# Patient Record
Sex: Female | Born: 2000 | Hispanic: No | Smoking: Never smoker
Health system: Southern US, Community
[De-identification: ages and names within clinical notes are randomized; demographics above are authoritative.]

## PROBLEM LIST (undated history)

## (undated) DIAGNOSIS — B54 Unspecified malaria: Secondary | ICD-10-CM

## (undated) HISTORY — DX: Unspecified malaria: B54

---
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
## 2018-03-28 ENCOUNTER — Encounter (HOSPITAL_COMMUNITY): Payer: Self-pay

## 2018-03-28 ENCOUNTER — Emergency Department (HOSPITAL_COMMUNITY): Payer: Medicaid Other

## 2018-03-28 ENCOUNTER — Emergency Department (HOSPITAL_COMMUNITY)
Admission: EM | Admit: 2018-03-28 | Discharge: 2018-03-28 | Disposition: A | Discharge status: Home / Self Care | Payer: Medicaid Other | Attending: Emergency Medicine | Admitting: Emergency Medicine

## 2018-03-28 ENCOUNTER — Other Ambulatory Visit: Payer: Self-pay

## 2018-03-28 DIAGNOSIS — Y999 Unspecified external cause status: Secondary | ICD-10-CM | POA: Insufficient documentation

## 2018-03-28 DIAGNOSIS — Y9389 Activity, other specified: Secondary | ICD-10-CM | POA: Diagnosis not present

## 2018-03-28 DIAGNOSIS — S0083XA Contusion of other part of head, initial encounter: Secondary | ICD-10-CM | POA: Diagnosis not present

## 2018-03-28 DIAGNOSIS — S8991XA Unspecified injury of right lower leg, initial encounter: Secondary | ICD-10-CM | POA: Diagnosis not present

## 2018-03-28 DIAGNOSIS — S8001XA Contusion of right knee, initial encounter: Secondary | ICD-10-CM | POA: Diagnosis not present

## 2018-03-28 DIAGNOSIS — R079 Chest pain, unspecified: Secondary | ICD-10-CM | POA: Diagnosis not present

## 2018-03-28 DIAGNOSIS — M25561 Pain in right knee: Secondary | ICD-10-CM | POA: Diagnosis not present

## 2018-03-28 DIAGNOSIS — S0990XA Unspecified injury of head, initial encounter: Secondary | ICD-10-CM | POA: Diagnosis not present

## 2018-03-28 DIAGNOSIS — R52 Pain, unspecified: Secondary | ICD-10-CM | POA: Diagnosis not present

## 2018-03-28 DIAGNOSIS — Y929 Unspecified place or not applicable: Secondary | ICD-10-CM | POA: Diagnosis not present

## 2018-03-28 DIAGNOSIS — R0789 Other chest pain: Secondary | ICD-10-CM | POA: Diagnosis not present

## 2018-03-28 DIAGNOSIS — S299XXA Unspecified injury of thorax, initial encounter: Secondary | ICD-10-CM | POA: Diagnosis not present

## 2018-03-28 DIAGNOSIS — S0003XA Contusion of scalp, initial encounter: Secondary | ICD-10-CM

## 2018-03-28 MED ORDER — CYCLOBENZAPRINE HCL 10 MG PO TABS: 10.0000 mg | ORAL_TABLET | Freq: Two times a day (BID) | ORAL | 0 refills | Status: DC | PRN

## 2018-03-28 MED ORDER — ACETAMINOPHEN 325 MG PO TABS
650.0000 mg | ORAL_TABLET | Freq: Once | ORAL | Status: AC
  Administered 2018-03-28: 650 mg via ORAL
  Filled 2018-03-28: qty 2

## 2018-03-28 MED ORDER — IBUPROFEN 400 MG PO TABS
600.0000 mg | ORAL_TABLET | Freq: Once | ORAL | Status: AC
  Administered 2018-03-28: 600 mg via ORAL
  Filled 2018-03-28: qty 1
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
## 2018-03-28 NOTE — Discharge Instructions (Signed)
You can take 2 ibuprofen and one Tylenol together every 6 hours for the pain.  Also you were prescribed a muscle relaxer that you can take at night before you go to bed to help with the stiffness in the morning.  However the muscle relaxer may make you feel sleepy.  The pain will worsen over the next 2 days and will hurt the most most likely on Sunday or Monday.  This is normal.  Continue to rest and take it easy.
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
## 2018-03-28 NOTE — ED Notes (Signed)
Pt moved to progression bed

## 2018-03-28 NOTE — ED Notes (Signed)
Pt verbalized understanding of d/c instructions and has no further questions, VSS, NAD.  

## 2018-03-28 NOTE — ED Notes (Signed)
c-collar removed by Dr Anitra Lauth

## 2018-10-09 DIAGNOSIS — R51 Headache: Secondary | ICD-10-CM | POA: Diagnosis not present

## 2018-10-09 DIAGNOSIS — Y999 Unspecified external cause status: Secondary | ICD-10-CM | POA: Diagnosis not present

## 2018-10-09 DIAGNOSIS — M542 Cervicalgia: Secondary | ICD-10-CM | POA: Diagnosis not present

## 2018-10-09 DIAGNOSIS — Z3202 Encounter for pregnancy test, result negative: Secondary | ICD-10-CM | POA: Diagnosis not present

## 2018-10-09 DIAGNOSIS — Y92410 Unspecified street and highway as the place of occurrence of the external cause: Secondary | ICD-10-CM | POA: Diagnosis not present

## 2018-10-09 DIAGNOSIS — R072 Precordial pain: Secondary | ICD-10-CM | POA: Diagnosis not present

## 2018-10-09 DIAGNOSIS — M79659 Pain in unspecified thigh: Secondary | ICD-10-CM | POA: Diagnosis not present

## 2018-10-09 DIAGNOSIS — M79604 Pain in right leg: Secondary | ICD-10-CM | POA: Diagnosis not present

## 2018-10-09 DIAGNOSIS — R000 Tachycardia, unspecified: Secondary | ICD-10-CM | POA: Diagnosis not present

## 2018-10-09 DIAGNOSIS — R079 Chest pain, unspecified: Secondary | ICD-10-CM | POA: Diagnosis not present

## 2018-10-09 DIAGNOSIS — R52 Pain, unspecified: Secondary | ICD-10-CM | POA: Diagnosis not present

## 2018-10-09 DIAGNOSIS — Z041 Encounter for examination and observation following transport accident: Secondary | ICD-10-CM | POA: Diagnosis not present

## 2018-10-10 DIAGNOSIS — I452 Bifascicular block: Secondary | ICD-10-CM | POA: Diagnosis not present

## 2018-11-20 DIAGNOSIS — Z23 Encounter for immunization: Secondary | ICD-10-CM | POA: Diagnosis not present

## 2019-04-02 ENCOUNTER — Ambulatory Visit: Payer: Medicaid Other | Attending: Internal Medicine

## 2019-05-20 DIAGNOSIS — Z113 Encounter for screening for infections with a predominantly sexual mode of transmission: Secondary | ICD-10-CM | POA: Diagnosis not present

## 2019-06-02 ENCOUNTER — Other Ambulatory Visit: Payer: Self-pay | Admitting: Pediatrics

## 2019-06-02 ENCOUNTER — Encounter: Payer: Self-pay | Admitting: Pediatrics

## 2019-06-02 NOTE — Progress Notes (Unsigned)
Adolescent Well Care Visit Melissa Lutz is a 19 y.o. female who is here for well care.    PCP:  Tilman Neat, MD   History was provided by the {CHL AMB PERSONS; PED RELATIVES/OTHER W/[phone]}.  Confidentiality was discussed with the patient and, if applicable, with caregiver as well. Patient's personal or confidential phone number: ***  Current Issues: Current concerns include ***.  Last well visit many years ago; most salient problem recurrent herpes labialis Last BMI 2019 : ~ 67%ile Current meds:  Nutrition: Nutrition/eating behaviors: *** Adequate calcium in diet?: *** Supplements/ vitamins: ***  Exercise/ Media: Play any sports? *** Exercise: *** Screen time:  {CHL AMB SCREEN TIME:[phone]} Media rules or monitoring?: {YES NO:22349}  Sleep:  Sleep: ***  Social Screening: Lives with:  *** Parental relations:  {CHL AMB PED FAM RELATIONSHIPS:[phone]} Activities, work, and chores?: *** Concerns regarding behavior with peers?  {yes***/no:17258} Stressors of note: {Responses; yes**/no:17258}  Education: School grade and name: ***  School performance: {performance:16655} School behavior: {misc; parental coping:16655}  Menstruation:   No LMP recorded. Menstrual history: ***   Tobacco?  {YES/NO/WILD CARDS:18581} Secondhand smoke exposure?  {YES/NO/WILD BDZHG:99242} Drugs/ETOH?  {YES/NO/WILD ASTMH:96222}  Sexually Active?  {YES [id number]   Pregnancy Prevention: ***  Safe at home, in school & in relationships?  {Yes or If no, why not?:20788} Safe to self?  {Yes or If no, why not?:20788}   Screenings: Patient has a dental home: {yes/no***:64::"yes"}  The patient completed the Rapid Assessment for Adolescent Preventive Services screening questionnaire and the following topics were identified as risk factors and discussed: {CHL AMB ASSESSMENT TOPICS:21012045} and counseling provided.  Other topics of anticipatory guidance related to  reproductive health, substance use and media use were discussed.     PHQ-9 completed and results indicated ***  Physical Exam:  There were no vitals filed for this visit. There were no vitals taken for this visit. Body mass index: body mass index is unknown because there is no height or weight on file. Growth percentile SmartLinks can only be used for patients less than 78 years old.  No exam data present  General Appearance:   {PE GENERAL APPEARANCE:22457}  HENT: normocephalic, no obvious abnormality, conjunctiva clear  Mouth:   oropharynx moist, palate, tongue and gums normal; teeth ***  Neck:   supple, no adenopathy; thyroid: symmetric, no enlargement, no tenderness/mass/nodules  Chest Normal female female with breasts: {EXAMLarrie Kass  Lungs:   clear to auscultation bilaterally, even air movement   Heart:   regular rate and rhythm, S1 and S2 normal, no murmurs   Abdomen:   soft, non-tender, normal bowel sounds; no mass, or organomegaly  GU {adol gu exam:315266}  Musculoskeletal:   tone and strength strong and symmetrical, all extremities full range of motion           Lymphatic:   no adenopathy  Skin/Hair/Nails:   skin warm and dry; no bruises, no rashes, no lesions  Neurologic:   oriented, no focal deficits; strength, gait, and coordination normal and age-appropriate     Assessment and Plan:   ***  BMI {ACTION; IS/IS LNL:89211941} appropriate for age  Hearing screening result:{normal/abnormal/not examined:14677} Vision screening result: {normal/abnormal/not examined:14677}  Counseling provided for {CHL AMB PED VACCINE COUNSELING:210130100} vaccine components No orders of the defined types were placed in this encounter.    No follow-ups on file.Leda Min, MD

## 2019-06-03 ENCOUNTER — Encounter: Payer: Self-pay | Admitting: Pediatrics

## 2019-06-03 ENCOUNTER — Other Ambulatory Visit: Payer: Self-pay

## 2019-06-03 ENCOUNTER — Other Ambulatory Visit (HOSPITAL_COMMUNITY)
Admission: RE | Admit: 2019-06-03 | Discharge: 2019-06-03 | Disposition: A | Discharge status: Home / Self Care | Payer: Medicaid Other | Source: Ambulatory Visit | Attending: Pediatrics | Admitting: Pediatrics

## 2019-06-03 ENCOUNTER — Ambulatory Visit (INDEPENDENT_AMBULATORY_CARE_PROVIDER_SITE_OTHER): Payer: Medicaid Other | Admitting: Pediatrics

## 2019-06-03 VITALS — BP 106/60 | HR 87 | Ht 63.82 in | Wt 159.2 lb

## 2019-06-03 DIAGNOSIS — Z3201 Encounter for pregnancy test, result positive: Secondary | ICD-10-CM | POA: Diagnosis not present

## 2019-06-03 DIAGNOSIS — Z113 Encounter for screening for infections with a predominantly sexual mode of transmission: Secondary | ICD-10-CM | POA: Diagnosis not present

## 2019-06-03 DIAGNOSIS — Z0110 Encounter for examination of ears and hearing without abnormal findings: Secondary | ICD-10-CM | POA: Diagnosis not present

## 2019-06-03 DIAGNOSIS — Z0100 Encounter for examination of eyes and vision without abnormal findings: Secondary | ICD-10-CM | POA: Diagnosis not present

## 2019-06-03 LAB — POCT URINE PREGNANCY: Preg Test, Ur: POSITIVE — AB

## 2019-06-03 LAB — POCT RAPID HIV: Rapid HIV, POC: NEGATIVE

## 2019-06-03 NOTE — Patient Instructions (Addendum)
Information about places to continue care: [facility] [address] [place], [place] [address] [phone]  Women's clinic at [facility] Department [address] [place] [phone] [email or web address]  Prenatal care at [facility] [email or web address] General phone number for [facility] is [phone]. Ask for prenatal care   Here are some offices taking new adult patients:  [facility] at [facility] [address] [place] [address] [phone]  [facility] [address] [place] [address] [phone]  [facility] [address] [place] [address] [phone]  [facility] [address] [place] [address] [phone]

## 2019-06-03 NOTE — Progress Notes (Signed)
Adolescent Well Care Visit Melissa Lutz is a 19 y.o. female who is here for well care.    PCP:  Tilman Neat, MD   History was provided by the patient.  Confidentiality was discussed with the patient and, if applicable, with caregiver as well. Patient's personal or confidential phone number: [phone]  Current Issues: Current concerns include  Possible pregnancy One episode of unprotected sex March 19 - approx 2 weeks ago One lifetime partner Good feeling about partner, now living in [place] with good job Menses due in next day or two; no menses missed Made appt also at Urgent Care for Friday to check pregnancy status   Last well visit July 2015 Interval visits for recurrent herpes labialis and menstrual cramps   Physical Exam:  Vitals:   06/03/19 0959  BP: 106/60  Pulse: 87  SpO2: 91%  Weight: 159 lb 3.2 oz (72.2 kg)  Height: 5' 3.82" (1.621 m)   BP 106/60 (BP Location: Right Arm, Patient Position: Sitting)   Pulse 87   Ht 5' 3.82" (1.621 m)   Wt 159 lb 3.2 oz (72.2 kg)   SpO2 91%   BMI 27.48 kg/m  Body mass index: body mass index is 27.48 kg/m. Growth percentile SmartLinks can only be used for patients less than 45 years old.   Hearing Screening   125Hz  250Hz  500Hz  1000Hz  2000Hz  3000Hz  4000Hz  6000Hz  8000Hz   Right ear:   20 20 20  20     Left ear:   20 20 20  20       Visual Acuity Screening   Right eye Left eye Both eyes  Without correction: 20/25 20/20 20/20   With correction:       General Appearance:   alert, oriented, no acute distress and well nourished  HENT: normocephalic, no obvious abnormality, conjunctiva clear       Neck:   supple, no adenopathy; thyroid: symmetric, no enlargement, no tenderness/mass/nodules  Chest Normal female with breasts:  Lungs:   clear to auscultation bilaterally, even air movement   Heart:   regular rate and rhythm, S1 and S2 normal, no murmurs   Abdomen:   soft, non-tender, normal bowel sounds; no mass, or  organomegaly           Skin/Hair/Nails:   skin warm and dry; no bruises, no rashes, no lesions  Neurologic:   oriented, no focal deficits; strength, gait, and coordination normal and age-appropriate     Assessment and Plan:   Positive pregnancy test Unlikely false positive  Comfortable with result and with informing partner May move to where partner lives and works on local prenatal care if staying in [place]  BMI is appropriate for age  Hearing screening result:normal Vision screening result: normal  No imms today - too old for flu here Orders Placed This Encounter  Procedures  . POCT Rapid HIV   Phone/MyChart follow up on STI screening done today Info given on adult practices taking new patients.  , MD

## 2019-06-04 LAB — URINE CYTOLOGY ANCILLARY ONLY
Chlamydia: NEGATIVE
Comment: NEGATIVE
Comment: NORMAL
Neisseria Gonorrhea: NEGATIVE

## 2019-06-04 NOTE — Progress Notes (Signed)
Please call Melissa Lutz and inform her that she has no sexually transmitted infection.  She is aware of pregnancy result.

## 2019-06-08 DIAGNOSIS — Z3201 Encounter for pregnancy test, result positive: Secondary | ICD-10-CM | POA: Diagnosis not present

## 2019-06-14 ENCOUNTER — Other Ambulatory Visit: Payer: Self-pay

## 2019-06-14 ENCOUNTER — Emergency Department (HOSPITAL_COMMUNITY): Payer: Medicaid Other

## 2019-06-14 ENCOUNTER — Emergency Department (HOSPITAL_COMMUNITY)
Admission: EM | Admit: 2019-06-14 | Discharge: 2019-06-14 | Disposition: A | Discharge status: Home / Self Care | Payer: Medicaid Other | Attending: Emergency Medicine | Admitting: Emergency Medicine

## 2019-06-14 DIAGNOSIS — Y999 Unspecified external cause status: Secondary | ICD-10-CM | POA: Diagnosis not present

## 2019-06-14 DIAGNOSIS — Y929 Unspecified place or not applicable: Secondary | ICD-10-CM | POA: Insufficient documentation

## 2019-06-14 DIAGNOSIS — M25572 Pain in left ankle and joints of left foot: Secondary | ICD-10-CM | POA: Insufficient documentation

## 2019-06-14 DIAGNOSIS — S39012A Strain of muscle, fascia and tendon of lower back, initial encounter: Secondary | ICD-10-CM | POA: Insufficient documentation

## 2019-06-14 DIAGNOSIS — S161XXA Strain of muscle, fascia and tendon at neck level, initial encounter: Secondary | ICD-10-CM | POA: Diagnosis not present

## 2019-06-14 DIAGNOSIS — M542 Cervicalgia: Secondary | ICD-10-CM | POA: Insufficient documentation

## 2019-06-14 DIAGNOSIS — M545 Low back pain: Secondary | ICD-10-CM | POA: Diagnosis present

## 2019-06-14 DIAGNOSIS — Y939 Activity, unspecified: Secondary | ICD-10-CM | POA: Insufficient documentation

## 2019-06-14 DIAGNOSIS — S199XXA Unspecified injury of neck, initial encounter: Secondary | ICD-10-CM | POA: Diagnosis not present

## 2019-06-14 DIAGNOSIS — R52 Pain, unspecified: Secondary | ICD-10-CM | POA: Diagnosis not present

## 2019-06-14 MED ORDER — METHOCARBAMOL 500 MG PO TABS
500.0000 mg | ORAL_TABLET | Freq: Once | ORAL | Status: AC
  Administered 2019-06-14: 500 mg via ORAL
  Filled 2019-06-14: qty 1

## 2019-06-14 MED ORDER — ACETAMINOPHEN 500 MG PO TABS
1000.0000 mg | ORAL_TABLET | Freq: Once | ORAL | Status: AC
  Administered 2019-06-14: 1000 mg via ORAL
  Filled 2019-06-14: qty 2

## 2019-06-14 MED ORDER — NAPROXEN 250 MG PO TABS
500.0000 mg | ORAL_TABLET | Freq: Once | ORAL | Status: AC
  Administered 2019-06-14: 500 mg via ORAL
  Filled 2019-06-14: qty 2

## 2019-06-14 NOTE — Discharge Instructions (Signed)
Alternate ice and heat to areas of injury 3-4 times per day to limit inflammation and spasm.  Avoid strenuous activity and heavy lifting.  We recommend consistent use of Tylenol for pain. You are unable to take Advil or Aleve while pregnant.  We recommend follow-up with a primary care doctor to ensure resolution of symptoms.  Return to the ED for any new or concerning symptoms.
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
## 2019-06-14 NOTE — ED Notes (Addendum)
This RN marked the Robaxin and Naproxen as given. Pt did not receive the medications after disclosing she is pregnant. Medications wasted, witnessed by Leory Plowman, RN. Tresa Endo, PA notified.
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
## 2019-06-14 NOTE — ED Notes (Signed)
Discharge instructions reviewed with pt. Pt verbalized understanding.   

## 2019-07-17 ENCOUNTER — Encounter: Payer: Self-pay | Admitting: Pediatrics

## 2019-08-14 DIAGNOSIS — Z331 Pregnant state, incidental: Secondary | ICD-10-CM | POA: Diagnosis not present

## 2019-08-27 DIAGNOSIS — Z113 Encounter for screening for infections with a predominantly sexual mode of transmission: Secondary | ICD-10-CM | POA: Diagnosis not present

## 2019-08-27 DIAGNOSIS — O3680X1 Pregnancy with inconclusive fetal viability, fetus 1: Secondary | ICD-10-CM | POA: Diagnosis not present

## 2019-08-27 DIAGNOSIS — Z1379 Encounter for other screening for genetic and chromosomal anomalies: Secondary | ICD-10-CM | POA: Diagnosis not present

## 2019-08-27 DIAGNOSIS — Z3491 Encounter for supervision of normal pregnancy, unspecified, first trimester: Secondary | ICD-10-CM | POA: Diagnosis not present

## 2019-08-27 DIAGNOSIS — Z118 Encounter for screening for other infectious and parasitic diseases: Secondary | ICD-10-CM | POA: Diagnosis not present

## 2019-09-29 DIAGNOSIS — Z3491 Encounter for supervision of normal pregnancy, unspecified, first trimester: Secondary | ICD-10-CM | POA: Diagnosis not present

## 2019-09-29 DIAGNOSIS — O2692 Pregnancy related conditions, unspecified, second trimester: Secondary | ICD-10-CM | POA: Diagnosis not present

## 2019-09-29 DIAGNOSIS — Z3492 Encounter for supervision of normal pregnancy, unspecified, second trimester: Secondary | ICD-10-CM | POA: Diagnosis not present

## 2019-11-05 ENCOUNTER — Encounter: Payer: Self-pay | Admitting: Pediatrics

## 2019-12-14 DIAGNOSIS — Z3492 Encounter for supervision of normal pregnancy, unspecified, second trimester: Secondary | ICD-10-CM | POA: Diagnosis not present

## 2019-12-14 DIAGNOSIS — Z3491 Encounter for supervision of normal pregnancy, unspecified, first trimester: Secondary | ICD-10-CM | POA: Diagnosis not present

## 2020-02-22 DIAGNOSIS — Z3A41 41 weeks gestation of pregnancy: Secondary | ICD-10-CM | POA: Diagnosis not present

## 2020-02-22 DIAGNOSIS — O480 Post-term pregnancy: Secondary | ICD-10-CM | POA: Diagnosis not present

## 2020-02-22 DIAGNOSIS — Z20822 Contact with and (suspected) exposure to covid-19: Secondary | ICD-10-CM | POA: Diagnosis not present

## 2020-02-25 DIAGNOSIS — Z3A00 Weeks of gestation of pregnancy not specified: Secondary | ICD-10-CM | POA: Diagnosis not present

## 2020-02-25 DIAGNOSIS — Z3A41 41 weeks gestation of pregnancy: Secondary | ICD-10-CM | POA: Diagnosis not present

## 2020-02-28 DIAGNOSIS — O9089 Other complications of the puerperium, not elsewhere classified: Secondary | ICD-10-CM | POA: Diagnosis not present

## 2020-02-28 DIAGNOSIS — Z9889 Other specified postprocedural states: Secondary | ICD-10-CM | POA: Diagnosis not present

## 2020-02-28 DIAGNOSIS — R519 Headache, unspecified: Secondary | ICD-10-CM | POA: Diagnosis not present

## 2020-03-10 DIAGNOSIS — O9953 Diseases of the respiratory system complicating the puerperium: Secondary | ICD-10-CM | POA: Diagnosis not present

## 2020-03-10 DIAGNOSIS — J029 Acute pharyngitis, unspecified: Secondary | ICD-10-CM | POA: Diagnosis not present

## 2020-03-10 DIAGNOSIS — Z20822 Contact with and (suspected) exposure to covid-19: Secondary | ICD-10-CM | POA: Diagnosis not present

## 2020-03-10 DIAGNOSIS — R059 Cough, unspecified: Secondary | ICD-10-CM | POA: Diagnosis not present

## 2020-03-10 DIAGNOSIS — O9089 Other complications of the puerperium, not elsewhere classified: Secondary | ICD-10-CM | POA: Diagnosis not present

## 2020-03-10 DIAGNOSIS — R079 Chest pain, unspecified: Secondary | ICD-10-CM | POA: Diagnosis not present

## 2020-03-23 DIAGNOSIS — Z1152 Encounter for screening for COVID-19: Secondary | ICD-10-CM | POA: Diagnosis not present

## 2020-03-23 DIAGNOSIS — K5909 Other constipation: Secondary | ICD-10-CM | POA: Diagnosis not present

## 2020-03-23 DIAGNOSIS — K648 Other hemorrhoids: Secondary | ICD-10-CM | POA: Diagnosis not present

## 2020-04-05 DIAGNOSIS — K5900 Constipation, unspecified: Secondary | ICD-10-CM | POA: Diagnosis not present

## 2020-04-05 DIAGNOSIS — K648 Other hemorrhoids: Secondary | ICD-10-CM | POA: Diagnosis not present

## 2020-04-14 ENCOUNTER — Telehealth: Payer: Self-pay

## 2020-04-14 DIAGNOSIS — Z09 Encounter for follow-up examination after completed treatment for conditions other than malignant neoplasm: Secondary | ICD-10-CM

## 2020-04-14 DIAGNOSIS — Z3202 Encounter for pregnancy test, result negative: Secondary | ICD-10-CM | POA: Diagnosis not present

## 2020-04-14 DIAGNOSIS — Z3046 Encounter for surveillance of implantable subdermal contraceptive: Secondary | ICD-10-CM | POA: Diagnosis not present

## 2020-04-14 DIAGNOSIS — R102 Pelvic and perineal pain: Secondary | ICD-10-CM | POA: Diagnosis not present

## 2020-04-14 NOTE — Telephone Encounter (Signed)
Letter of notice to transition to Adult primary care was returned to sender. Pt dismissed from [facility] due to age of 31. Can contact SWCM if needing support selecting adult PCP.    Kenn File, BSW, QP Case Manager [facility] [employer] [facility] Office: [phone] Direct Number: [phone]

## 2020-06-08 DIAGNOSIS — Z202 Contact with and (suspected) exposure to infections with a predominantly sexual mode of transmission: Secondary | ICD-10-CM | POA: Diagnosis not present

## 2020-06-08 DIAGNOSIS — Z124 Encounter for screening for malignant neoplasm of cervix: Secondary | ICD-10-CM | POA: Diagnosis not present

## 2020-06-08 DIAGNOSIS — Z0001 Encounter for general adult medical examination with abnormal findings: Secondary | ICD-10-CM | POA: Diagnosis not present

## 2020-06-08 DIAGNOSIS — Z01419 Encounter for gynecological examination (general) (routine) without abnormal findings: Secondary | ICD-10-CM | POA: Diagnosis not present

## 2020-09-17 ENCOUNTER — Encounter (HOSPITAL_COMMUNITY): Payer: Self-pay

## 2020-09-17 ENCOUNTER — Ambulatory Visit (HOSPITAL_COMMUNITY)
Admission: EM | Admit: 2020-09-17 | Discharge: 2020-09-17 | Disposition: A | Discharge status: Home / Self Care | Payer: Medicaid Other | Attending: Internal Medicine | Admitting: Internal Medicine

## 2020-09-17 DIAGNOSIS — Z3202 Encounter for pregnancy test, result negative: Secondary | ICD-10-CM | POA: Diagnosis not present

## 2020-09-17 DIAGNOSIS — Z113 Encounter for screening for infections with a predominantly sexual mode of transmission: Secondary | ICD-10-CM

## 2020-09-17 LAB — POCT URINALYSIS DIPSTICK, ED / UC
Bilirubin Urine: NEGATIVE
Glucose, UA: NEGATIVE mg/dL
Hgb urine dipstick: NEGATIVE
Ketones, ur: NEGATIVE mg/dL
Leukocytes,Ua: NEGATIVE
Nitrite: NEGATIVE
Protein, ur: NEGATIVE mg/dL
Specific Gravity, Urine: 1.025 (ref 1.005–1.030)
Urobilinogen, UA: 0.2 mg/dL (ref 0.0–1.0)
pH: 5.5 (ref 5.0–8.0)

## 2020-09-17 LAB — POC URINE PREG, ED: Preg Test, Ur: NEGATIVE

## 2020-09-17 NOTE — ED Triage Notes (Signed)
Pt presents for pregnancy and STD testing.   States she has noticed her stomach is getting bigger and c/o nausea.

## 2020-09-17 NOTE — Discharge Instructions (Addendum)
Will call with test results Follow up with OBGYN for menstrual cycle concerns.

## 2020-09-17 NOTE — ED Provider Notes (Signed)
[facility]    CSN: [id number] Arrival date & time: 09/17/20  1141      History   Chief Complaint Chief Complaint  Patient presents with   Possible Pregnancy   SEXUALLY TRANSMITTED DISEASE    HPI Melissa Lutz is a 20 y.o. female.   Pt with nexplanon in place, placed about 4 months ago.  Pt is 6 months postpartum.  She is currently breastfeeding.  LMP about 2 months ago.  Requesting pregnancy test today. Follows with OBGYN.   She is also requesting STD testing. Pt one monogamous relationship.  Partner asymptomatic at this time.    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 No pertinent surgical history.  OB History     Gravida  0   Para  0   Term  0   Preterm  0   AB  0   Living         SAB  0   IAB  0   Ectopic  0   Multiple      Live Births               Home Medications    Prior to Admission medications   Not on File    Family History Family History  Problem Relation Age of Onset   Heart disease Mother 25       SD secvundum   Hypertension Mother    Asthma Neg Hx    Cancer Neg Hx    Diabetes Neg Hx    Drug abuse Neg Hx    Early death Neg Hx     Social History Social History   Tobacco Use   Smoking status: Never   Smokeless tobacco: Never  Substance Use Topics   Alcohol use: No     Allergies   Nickel   Review of Systems Review of Systems  Constitutional:  Negative for chills and fever.  HENT:  Negative for ear pain and sore throat.   Eyes:  Negative for pain and visual disturbance.  Respiratory:  Negative for cough and shortness of breath.   Cardiovascular:  Negative for chest pain and  palpitations.  Gastrointestinal:  Negative for abdominal pain and vomiting.  Genitourinary:  Negative for difficulty urinating, dysuria, flank pain, frequency, hematuria, menstrual problem, pelvic pain, vaginal bleeding, vaginal discharge and vaginal pain.  Musculoskeletal:  Negative for arthralgias and back pain.  Skin:  Negative for color change and rash.  Neurological:  Negative for seizures and syncope.  All other systems reviewed and are negative.   Physical Exam Triage Vital Signs ED Triage Vitals  Enc Vitals Group     BP 09/17/20 1205 111/69     Pulse Rate 09/17/20 1205 81     Resp 09/17/20 1205 19     Temp 09/17/20 1205 98.1 F (36.7 C)     Temp Source 09/17/20 1205 Oral     SpO2 09/17/20 1205 99 %     Weight --      Height --      Head Circumference --      Peak Flow --      Pain Score  09/17/20 1203 0     Pain Loc --      Pain Edu? --      Excl. in GC? --    No data found.  Updated Vital Signs BP 111/69 (BP Location: Left Arm)   Pulse 81   Temp 98.1 F (36.7 C) (Oral)   Resp 19   LMP  (LMP Unknown)   SpO2 99%   Visual Acuity Right Eye Distance:   Left Eye Distance:   Bilateral Distance:    Right Eye Near:   Left Eye Near:    Bilateral Near:     Physical Exam Vitals and nursing note reviewed.  Constitutional:      General: She is not in acute distress.    Appearance: She is well-developed.  HENT:     Head: Normocephalic and atraumatic.  Eyes:     Conjunctiva/sclera: Conjunctivae normal.  Cardiovascular:     Rate and Rhythm: Normal rate and regular rhythm.     Heart sounds: No murmur heard. Pulmonary:     Effort: Pulmonary effort is normal. No respiratory distress.     Breath sounds: Normal breath sounds.  Abdominal:     Palpations: Abdomen is soft.     Tenderness: There is no abdominal tenderness.  Musculoskeletal:     Cervical back: Neck supple.  Skin:    General: Skin is warm and dry.  Neurological:     Mental Status: She is alert.      UC Treatments / Results  Labs (all labs ordered are listed, but only abnormal results are displayed) Labs Reviewed  POCT URINALYSIS DIPSTICK, ED / UC  POC URINE PREG, ED    EKG   Radiology No results found.  Procedures Procedures (including critical care time)  Medications Ordered in UC Medications - No data to display  Initial Impression / Assessment and Plan / UC Course  I have reviewed the triage vital signs and the nursing notes.  Pertinent labs & imaging results that were available during my care of the patient were reviewed by me and considered in my medical decision making (see chart for details).     Urine pregnancy negative today.  Pt with nexplanon in place with irregular menstrual cycles.  STD testing pending. Pt asymptomatic, will treat based on test results.  Final Clinical Impressions(s) / UC Diagnoses   Final diagnoses:  None   Discharge Instructions   None    ED Prescriptions   None    PDMP not reviewed this encounter.   Jodell Cipro, PA-C 09/17/20 1253

## 2020-09-19 LAB — CERVICOVAGINAL ANCILLARY ONLY
Bacterial Vaginitis (gardnerella): NEGATIVE
Candida Glabrata: NEGATIVE
Candida Vaginitis: NEGATIVE
Chlamydia: NEGATIVE
Comment: NEGATIVE
Comment: NEGATIVE
Comment: NEGATIVE
Comment: NEGATIVE
Comment: NEGATIVE
Comment: NORMAL
Neisseria Gonorrhea: NEGATIVE
Trichomonas: NEGATIVE
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
## 2021-04-18 DIAGNOSIS — R635 Abnormal weight gain: Secondary | ICD-10-CM | POA: Diagnosis not present

## 2021-04-18 DIAGNOSIS — N925 Other specified irregular menstruation: Secondary | ICD-10-CM | POA: Diagnosis not present

## 2021-04-18 DIAGNOSIS — Z3046 Encounter for surveillance of implantable subdermal contraceptive: Secondary | ICD-10-CM | POA: Diagnosis not present

## 2021-07-19 DIAGNOSIS — Z01419 Encounter for gynecological examination (general) (routine) without abnormal findings: Secondary | ICD-10-CM | POA: Diagnosis not present

## 2021-07-19 DIAGNOSIS — Z124 Encounter for screening for malignant neoplasm of cervix: Secondary | ICD-10-CM | POA: Diagnosis not present

## 2021-07-19 DIAGNOSIS — Z0000 Encounter for general adult medical examination without abnormal findings: Secondary | ICD-10-CM | POA: Diagnosis not present

## 2021-09-12 DIAGNOSIS — R7989 Other specified abnormal findings of blood chemistry: Secondary | ICD-10-CM | POA: Diagnosis not present

## 2022-08-28 ENCOUNTER — Other Ambulatory Visit: Payer: Self-pay

## 2022-08-28 ENCOUNTER — Emergency Department (HOSPITAL_COMMUNITY): Payer: Medicaid Other

## 2022-08-28 ENCOUNTER — Emergency Department (HOSPITAL_COMMUNITY)
Admission: EM | Admit: 2022-08-28 | Discharge: 2022-08-28 | Disposition: A | Discharge status: Home / Self Care | Payer: Medicaid Other | Attending: Emergency Medicine | Admitting: Emergency Medicine

## 2022-08-28 DIAGNOSIS — Z9189 Other specified personal risk factors, not elsewhere classified: Secondary | ICD-10-CM

## 2022-08-28 DIAGNOSIS — Z7722 Contact with and (suspected) exposure to environmental tobacco smoke (acute) (chronic): Secondary | ICD-10-CM | POA: Diagnosis present

## 2022-08-28 LAB — CBC
HCT: 36.1 % (ref 36.0–46.0)
Hemoglobin: 11.4 g/dL — ABNORMAL LOW (ref 12.0–15.0)
MCH: 29.7 pg (ref 26.0–34.0)
MCHC: 31.6 g/dL (ref 30.0–36.0)
MCV: 94 fL (ref 80.0–100.0)
Platelets: 207 10*3/uL (ref 150–400)
RBC: 3.84 MIL/uL — ABNORMAL LOW (ref 3.87–5.11)
RDW: 12.6 % (ref 11.5–15.5)
WBC: 6.1 10*3/uL (ref 4.0–10.5)
nRBC: 0 % (ref 0.0–0.2)

## 2022-08-28 LAB — COMPREHENSIVE METABOLIC PANEL
ALT: 20 U/L (ref 0–44)
AST: 22 U/L (ref 15–41)
Albumin: 4 g/dL (ref 3.5–5.0)
Alkaline Phosphatase: 56 U/L (ref 38–126)
Anion gap: 12 (ref 5–15)
BUN: 10 mg/dL (ref 6–20)
CO2: 24 mmol/L (ref 22–32)
Calcium: 9.5 mg/dL (ref 8.9–10.3)
Chloride: 101 mmol/L (ref 98–111)
Creatinine, Ser: 0.62 mg/dL (ref 0.44–1.00)
GFR, Estimated: >60 mL/min (ref >60)
Glucose, Bld: 112 mg/dL — ABNORMAL HIGH (ref 70–99)
Potassium: 3.4 mmol/L — ABNORMAL LOW (ref 3.5–5.1)
Sodium: 137 mmol/L (ref 135–145)
Total Bilirubin: 0.3 mg/dL (ref 0.3–1.2)
Total Protein: 7.9 g/dL (ref 6.5–8.1)

## 2022-08-28 NOTE — ED Provider Notes (Signed)
[place] EMERGENCY DEPARTMENT AT [facility] Provider Note   CSN: [id number] Arrival date & time: 08/28/22  1811     History  Chief Complaint  Patient presents with   Sore Throat    Melissa Lutz is a 22 y.o. female.  The history is provided by the patient and medical records. No language interpreter was used.  Sore Throat   22 year old female with prior history of malaria, presenting with complaint of concerns for secondhand smoke.  Patient states she recently started a new job where she was around a coworker that smokes heavily.  The job is in house and she mention the smoke has greatly affected her health.  She has been in this job for the past 3 weeks and yesterday was her last day because she cannot tolerate the environment.  States that she is having burning sensation in her nose, throat irritation, having pain in her chest as well as having shortness of breath whenever she is at work.  Her symptoms did seem to improve when she goes home.  Patient states she was googling secondhand smoke and felt a lot of her symptoms related to that.  She also reported family history of asthma and has symptoms concerns for her.  She does not endorse any fever, productive cough, wheezing, or rash. Patient request for blood work to be done.    Home Medications Prior to Admission medications   Not on File      Allergies    Nickel    Review of Systems   Review of Systems  All other systems reviewed and are negative.   Physical Exam Updated Vital Signs BP 110/64 (BP Location: Left Arm)   Pulse 81   Temp 98.4 F (36.9 C) (Oral)   Resp 18   SpO2 100%  Physical Exam Vitals and nursing note reviewed.  Constitutional:      General: She is not in acute distress.    Appearance: She is well-developed.  HENT:     Head: Atraumatic.     Nose: No congestion or rhinorrhea.     Mouth/Throat:     Mouth: Mucous membranes are moist. No oral lesions.     Pharynx: Uvula  midline. No oropharyngeal exudate.  Eyes:     Conjunctiva/sclera: Conjunctivae normal.  Cardiovascular:     Rate and Rhythm: Normal rate and regular rhythm.     Pulses: Normal pulses.     Heart sounds: Normal heart sounds.  Pulmonary:     Effort: Pulmonary effort is normal.     Breath sounds: No wheezing, rhonchi or rales.  Abdominal:     Palpations: Abdomen is soft.  Musculoskeletal:     Cervical back: Neck supple.  Skin:    Findings: No rash.  Neurological:     Mental Status: She is alert.  Psychiatric:        Mood and Affect: Mood normal.     ED Results / Procedures / Treatments   Labs (all labs ordered are listed, but only abnormal results are displayed) Labs Reviewed  COMPREHENSIVE METABOLIC PANEL - Abnormal; Notable for the following components:      Result Value   Potassium 3.4 (*)    Glucose, Bld 112 (*)    All other components within normal limits  CBC - Abnormal; Notable for the following components:   RBC 3.84 (*)    Hemoglobin 11.4 (*)    All other components within normal limits    EKG None  Radiology  DG Chest 2 View  Result Date: 08/28/2022 CLINICAL DATA:  Cough and shortness of breath. EXAM: CHEST - 2 VIEW COMPARISON:  03/28/2018 FINDINGS: The cardiomediastinal contours are normal. The lungs are clear. Pulmonary vasculature is normal. No consolidation, pleural effusion, or pneumothorax. No acute osseous abnormalities are seen. IMPRESSION: Negative radiographs of the chest. Electronically Signed   By: Narda Rutherford M.D.   On: 08/28/2022 22:49    Procedures Procedures    Medications Ordered in ED Medications - No data to display  ED Course/ Medical Decision Making/ A&P                             Medical Decision Making Amount and/or Complexity of Data Reviewed Labs: ordered. Radiology: ordered.   BP 110/64 (BP Location: Left Arm)   Pulse 81   Temp 98.4 F (36.9 C) (Oral)   Resp 18   SpO2 100%   74:74 PM 22 year old female with  prior history of malaria, presenting with complaint of concerns for secondhand smoke.  Patient states she recently started a new job where she was around a coworker that smokes heavily.  The job is in house and she mention the smoke has greatly affected her health.  She has been in this job for the past 3 weeks and yesterday was her last day because she cannot tolerate the environment.  States that she is having burning sensation in her nose, throat irritation, having pain in her chest as well as having shortness of breath whenever she is at work.  Her symptoms did seem to improve when she goes home.  Patient states she was googling secondhand smoke and felt a lot of her symptoms related to that.  She also reported family history of asthma and has symptoms concerns for her.  She does not endorse any fever, productive cough, wheezing, or rash. Patient request for blood work to be done.    On exam patient is resting comfortably in the bed appears to be in no acute discomfort.  She is able to speak in complete sentences and showing no evidence of any respiratory distress.  Ear nose and throat exam unremarkable, lungs are clear to auscultation without any wheezes rales or rhonchi abdomen is soft nontender no concerning skin rash.  Vital signs overall reassuring no fever no hypoxia.  -Labs ordered, independently viewed and interpreted by me.  Labs remarkable for reassuring electrolytes panel -The patient was maintained on a cardiac monitor.  I personally viewed and interpreted the cardiac monitored which showed an underlying rhythm of: NSR -Imaging independently viewed and interpreted by me and I agree with radiologist's interpretation.  Result remarkable for CXR without acute changes -This patient presents to the ED for concern of sob, this involves an extensive number of treatment options, and is a complaint that carries with it a high risk of complications and morbidity.  The differential diagnosis includes  smoke inhalation, asthma, reactive airway disease, viral illness, pneumonia, ptx -Co morbidities that complicate the patient evaluation includes none -Treatment includes reassurance -Reevaluation of the patient after these medicines showed that the patient stayed the same -PCP office notes or outside notes reviewed -Escalation to admission/observation considered: patients feels much better, is comfortable with discharge, and will follow up with PCP -Prescription medication considered, patient comfortable with OTC meds -Social Determinant of Health considered   11:05 PM Patient primary concern is exposure to secondhand smoke from her coworker who was smoking while at  work. Patient without any history of asthma.  On exam she is overall reassuring.  Workup is normal.  Reassurance given.  Patient states she is no longer working that environment and therefore I felt she is stable to be discharged home.  Return precaution discussed.  I have low suspicion for carbon monoxide poisoning or other acute emergent medical condition.  Doubt viral illness.           Final Clinical Impression(s) / ED Diagnoses Final diagnoses:  At risk from secondhand smoke exposure    Rx / DC Orders ED Discharge Orders     None         Fayrene Helper, PA-C 08/28/22 2310    Eudelia Bunch Amadeo Garnet, MD 08/29/22 0800

## 2022-08-28 NOTE — ED Triage Notes (Signed)
Pt presents with sore throat, congestion, nose burning and itching, and feeling fatigued. She states she feels this is from prolonged exposure to second hand smoke.  Requesting lab work.

## 2022-08-28 NOTE — Discharge Instructions (Signed)
You have been evaluated for your symptoms.  Fortunately your blood work is overall reassuring.  No evidence of anemia no electrolyte imbalance.  Your chest x-ray is normal as well.  Please avoid smoke exposure.  Return if you have any concern.

## 2022-08-28 NOTE — ED Notes (Signed)
Pt transported to xray 

## 2022-10-23 ENCOUNTER — Encounter: Payer: 59 | Admitting: Radiology

## 2022-11-29 ENCOUNTER — Encounter: Payer: 59 | Admitting: Obstetrics & Gynecology

## 2024-01-17 ENCOUNTER — Ambulatory Visit (HOSPITAL_COMMUNITY)
Admission: EM | Admit: 2024-01-17 | Discharge: 2024-01-17 | Disposition: A | Discharge status: Home / Self Care | Attending: Emergency Medicine | Admitting: Emergency Medicine

## 2024-01-17 ENCOUNTER — Encounter (HOSPITAL_COMMUNITY): Payer: Self-pay

## 2024-01-17 DIAGNOSIS — Z113 Encounter for screening for infections with a predominantly sexual mode of transmission: Secondary | ICD-10-CM | POA: Diagnosis present

## 2024-01-17 DIAGNOSIS — J069 Acute upper respiratory infection, unspecified: Secondary | ICD-10-CM | POA: Diagnosis not present

## 2024-01-17 DIAGNOSIS — R0789 Other chest pain: Secondary | ICD-10-CM | POA: Diagnosis not present

## 2024-01-17 LAB — POCT URINE PREGNANCY: Preg Test, Ur: NEGATIVE

## 2024-01-17 MED ORDER — PROMETHAZINE-DM 6.25-15 MG/5ML PO SYRP: 5.0000 mL | ORAL_SOLUTION | Freq: Every evening | ORAL | 0 refills | Status: AC | PRN

## 2024-01-17 MED ORDER — AEROCHAMBER PLUS FLO-VU MEDIUM MISC
1.0000 | Freq: Once | Status: AC
  Administered 2024-01-17: 1

## 2024-01-17 MED ORDER — ALBUTEROL SULFATE HFA 108 (90 BASE) MCG/ACT IN AERS
INHALATION_SPRAY | RESPIRATORY_TRACT | Status: AC
  Filled 2024-01-17: qty 6.7

## 2024-01-17 MED ORDER — FLUCONAZOLE 150 MG PO TABS: 150.0000 mg | ORAL_TABLET | Freq: Once | ORAL | 0 refills | Status: AC | PRN

## 2024-01-17 MED ORDER — ALBUTEROL SULFATE HFA 108 (90 BASE) MCG/ACT IN AERS
2.0000 | INHALATION_SPRAY | Freq: Once | RESPIRATORY_TRACT | Status: AC
  Administered 2024-01-17: 2 via RESPIRATORY_TRACT

## 2024-01-17 MED ORDER — GUAIFENESIN ER 600 MG PO TB12: 1200.0000 mg | ORAL_TABLET | Freq: Two times a day (BID) | ORAL | 0 refills | Status: AC

## 2024-01-17 NOTE — Discharge Instructions (Addendum)
 You likely have a viral respiratory illness.  This is treated with supportive care. I recommend plain guaifenesin (Mucinex) for congestion. Drink lots of water and fluids.  The promethazine DM cough syrup can be used before bedtime. It might make you drowsy so use caution.  Please use the inhaler 3 times daily for the next 4-5 days in a row for chest discomfort. You can take tylenol  and/or ibuprofen  for any pain or fevers. Please allow 4-5 more days for improvement in symptoms with these treatments. If after this time period you have persisting or worsening symptoms, please return  We will call you if anything on your swab returns positive. You can also see these results on MyChart. Please abstain from sexual intercourse until your results return. In the meantime I am treating you for yeast with fluconazole.  Take 1 pill tomorrow (Saturday), and the second pill in 3 days (Tuesday)

## 2024-01-17 NOTE — ED Provider Notes (Signed)
 [facility]    CSN: [id number] Arrival date & time: 01/17/24  1834      History   Chief Complaint Chief Complaint  Patient presents with   Cough   Vaginal Itching    HPI Melissa Lutz is a 23 y.o. female.  3 day history of nasal congestion and cough Cough is dry, causes chest discomfort, tightness  No fevers. Denies shortness of breath or wheezing.  Slight sore throat. No abdominal pain, nausea/vomiting Has been drinking tea. No other interventions attempted Sick contacts at her workplace   Vaginal itching and burning sensation for 2 weeks. Notes redness around clitoral area. Was concerned about labial swelling. Increased discharge. Would like STD testing. 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 No pertinent surgical history.  OB History     Gravida  0   Para  0   Term  0   Preterm  0   AB  0   Living         SAB  0   IAB  0   Ectopic  0   Multiple      Live Births               Home Medications    Prior to Admission medications   Medication Sig Start Date End Date Taking? Authorizing Provider  fluconazole (DIFLUCAN) 150 MG tablet Take 1 tablet (150 mg total) by mouth once as needed for up to 2 doses (take one pill on day 1, and the second pill 3 days later). 01/17/24  Yes Trina Asch, Asberry, PA-C  guaiFENesin (MUCINEX) 600 MG 12 hr tablet Take 2 tablets (1,200 mg total) by mouth 2 (two) times daily. 01/17/24  Yes Genese Quebedeaux, Asberry, PA-C  promethazine-dextromethorphan (PROMETHAZINE-DM) 6.25-15 MG/5ML syrup Take 5 mLs by mouth at bedtime as needed for cough. 01/17/24  Yes Yehoshua Vitelli, Asberry, PA-C    Family  History Family History  Problem Relation Age of Onset   Heart disease Mother 37       SD secvundum   Hypertension Mother    Asthma Neg Hx    Cancer Neg Hx    Diabetes Neg Hx    Drug abuse Neg Hx    Early death Neg Hx     Social History Social History   Tobacco Use   Smoking status: Never   Smokeless tobacco: Never  Substance Use Topics   Alcohol use: No     Allergies   Nickel   Review of Systems Review of Systems As per HPI  Physical Exam Triage Vital Signs ED Triage Vitals  Encounter Vitals Group     BP 01/17/24 1926 126/75     Girls Systolic BP Percentile --      Girls Diastolic BP Percentile --      Boys Systolic BP Percentile --      Boys Diastolic BP Percentile --      Pulse Rate 01/17/24 1926 86     Resp 01/17/24 1926 18     Temp 01/17/24 1926 98.2 F (36.8 C)     Temp Source 01/17/24 1926 Oral     SpO2 01/17/24  1926 98 %     Weight 01/17/24 1925 149 lb 14.6 oz (68 kg)     Height 01/17/24 1925 5' 4 (1.626 m)     Head Circumference --      Peak Flow --      Pain Score 01/17/24 1924 8     Pain Loc --      Pain Education --      Exclude from Growth Chart --    No data found.  Updated Vital Signs BP 126/75 (BP Location: Right Arm)   Pulse 86   Temp 98.2 F (36.8 C) (Oral)   Resp 18   Ht 5' 4 (1.626 m)   Wt 149 lb 14.6 oz (68 kg)   LMP 12/30/2023 (Approximate)   SpO2 98%   BMI 25.73 kg/m    Physical Exam Vitals and nursing note reviewed. Exam conducted with a chaperone present (Demetrice CMA).  Constitutional:      Appearance: She is not ill-appearing.  HENT:     Right Ear: Tympanic membrane and ear canal normal.     Left Ear: Tympanic membrane and ear canal normal.     Nose: Congestion present. No rhinorrhea.     Mouth/Throat:     Mouth: Mucous membranes are moist.     Pharynx: Oropharynx is clear. No posterior oropharyngeal erythema.  Eyes:     Conjunctiva/sclera: Conjunctivae normal.  Cardiovascular:     Rate and Rhythm:  Normal rate and regular rhythm.     Pulses: Normal pulses.     Heart sounds: Normal heart sounds.  Pulmonary:     Effort: Pulmonary effort is normal. No respiratory distress.     Breath sounds: Normal breath sounds. No wheezing, rhonchi or rales.  Abdominal:     Palpations: Abdomen is soft.     Tenderness: There is no abdominal tenderness. There is no guarding.  Genitourinary:    Vagina: No signs of injury. Vaginal discharge present. No lesions.     Comments: No erythema, rash, lesions, or swelling. There is thin milky discharge on exam.  Musculoskeletal:     Cervical back: Normal range of motion.  Lymphadenopathy:     Cervical: No cervical adenopathy.  Skin:    General: Skin is warm and dry.  Neurological:     Mental Status: She is alert and oriented to person, place, and time.     UC Treatments / Results  Labs (all labs ordered are listed, but only abnormal results are displayed) Labs Reviewed  POCT URINE PREGNANCY  CERVICOVAGINAL ANCILLARY ONLY    EKG  Radiology No results found.  Procedures Procedures   Medications Ordered in UC Medications  albuterol (VENTOLIN HFA) 108 (90 Base) MCG/ACT inhaler 2 puff (2 puffs Inhalation Given 01/17/24 2028)  AeroChamber Plus Flo-Vu Medium MISC 1 each (1 each Other Given 01/17/24 2028)    Initial Impression / Assessment and Plan / UC Course  I have reviewed the triage vital signs and the nursing notes.  Pertinent labs & imaging results that were available during my care of the patient were reviewed by me and considered in my medical decision making (see chart for details).  Viral URI with cough Albuterol inhaler in clinic for chest discomfort. Continue TID. Mucinex, promethazine DM, tylenol /ibuprofen , increase fluids, other supportive care. Likely prognosis discussed, return precautions  STD screen, vaginal itching and discharge  UPT negative.  Cytology swab pending. With itching cover for yeast with 2 dose fluconazole   Treat other positive result as indicated  Safe sex practices No questions at this time  Final Clinical Impressions(s) / UC Diagnoses   Final diagnoses:  Screen for STD (sexually transmitted disease)  Viral URI with cough  Chest discomfort     Discharge Instructions      You likely have a viral respiratory illness.  This is treated with supportive care. I recommend plain guaifenesin (Mucinex) for congestion. Drink lots of water and fluids.  The promethazine DM cough syrup can be used before bedtime. It might make you drowsy so use caution.  Please use the inhaler 3 times daily for the next 4-5 days in a row for chest discomfort. You can take tylenol  and/or ibuprofen  for any pain or fevers. Please allow 4-5 more days for improvement in symptoms with these treatments. If after this time period you have persisting or worsening symptoms, please return  We will call you if anything on your swab returns positive. You can also see these results on MyChart. Please abstain from sexual intercourse until your results return. In the meantime I am treating you for yeast with fluconazole.  Take 1 pill tomorrow (Saturday), and the second pill in 3 days (Tuesday)      ED Prescriptions     Medication Sig Dispense Auth. Provider   guaiFENesin (MUCINEX) 600 MG 12 hr tablet Take 2 tablets (1,200 mg total) by mouth 2 (two) times daily. 30 tablet Nelsy Madonna, PA-C   promethazine-dextromethorphan (PROMETHAZINE-DM) 6.25-15 MG/5ML syrup Take 5 mLs by mouth at bedtime as needed for cough. 240 mL Synia Douglass, PA-C   fluconazole (DIFLUCAN) 150 MG tablet Take 1 tablet (150 mg total) by mouth once as needed for up to 2 doses (take one pill on day 1, and the second pill 3 days later). 2 tablet Cataleyah Colborn, Asberry, PA-C      PDMP not reviewed this encounter.   Maddi Collar, Asberry, PA-C 01/17/24 2050

## 2024-01-17 NOTE — ED Triage Notes (Signed)
 Presenting with nasal congestion, cough with chest pain. Onset 1 week. States some people at work have been sick, unknown what they had.   Burning and vaginal itching especially around the clitoris. There is also redness in the vaginal area. Onset 2 weeks ago. Also having increased vaginal discharge.

## 2024-01-20 ENCOUNTER — Ambulatory Visit (HOSPITAL_COMMUNITY): Payer: Self-pay

## 2024-01-20 LAB — CERVICOVAGINAL ANCILLARY ONLY
Bacterial Vaginitis (gardnerella): POSITIVE — AB
Candida Glabrata: NEGATIVE
Candida Vaginitis: POSITIVE — AB
Chlamydia: NEGATIVE
Comment: NEGATIVE
Comment: NEGATIVE
Comment: NEGATIVE
Comment: NEGATIVE
Comment: NEGATIVE
Comment: NORMAL
Neisseria Gonorrhea: NEGATIVE
Trichomonas: NEGATIVE

## 2024-01-20 MED ORDER — METRONIDAZOLE 500 MG PO TABS: 500.0000 mg | ORAL_TABLET | Freq: Two times a day (BID) | ORAL | 0 refills | Status: AC
